# Patient Record
Sex: Male | Born: 1965 | Race: White | Hispanic: No | Smoking: Never smoker
Health system: Southern US, Community
[De-identification: ages and names within clinical notes are randomized; demographics above are authoritative.]

## PROBLEM LIST (undated history)

## (undated) DIAGNOSIS — I447 Left bundle-branch block, unspecified: Secondary | ICD-10-CM

## (undated) DIAGNOSIS — G894 Chronic pain syndrome: Secondary | ICD-10-CM

## (undated) HISTORY — PX: BACK SURGERY: SHX140

## (undated) HISTORY — PX: HERNIA REPAIR: SHX51

---

## 2006-04-12 ENCOUNTER — Ambulatory Visit: Payer: Self-pay | Admitting: Pain Medicine

## 2006-04-27 ENCOUNTER — Ambulatory Visit: Payer: Self-pay | Admitting: Pain Medicine

## 2006-04-28 ENCOUNTER — Ambulatory Visit: Payer: Self-pay | Admitting: Pain Medicine

## 2006-05-21 ENCOUNTER — Inpatient Hospital Stay: Payer: Self-pay | Admitting: Internal Medicine

## 2006-05-21 ENCOUNTER — Other Ambulatory Visit: Payer: Self-pay

## 2006-05-22 ENCOUNTER — Other Ambulatory Visit: Payer: Self-pay

## 2006-05-26 ENCOUNTER — Ambulatory Visit: Payer: Self-pay | Admitting: Pain Medicine

## 2006-06-28 ENCOUNTER — Ambulatory Visit: Payer: Self-pay | Admitting: Pain Medicine

## 2006-07-06 ENCOUNTER — Encounter: Payer: Self-pay | Admitting: Pain Medicine

## 2006-07-12 ENCOUNTER — Ambulatory Visit: Payer: Self-pay | Admitting: Gastroenterology

## 2006-07-28 ENCOUNTER — Ambulatory Visit: Payer: Self-pay | Admitting: Pain Medicine

## 2006-08-25 ENCOUNTER — Ambulatory Visit: Payer: Self-pay | Admitting: Pain Medicine

## 2006-09-22 ENCOUNTER — Ambulatory Visit: Payer: Self-pay | Admitting: Pain Medicine

## 2006-10-01 ENCOUNTER — Ambulatory Visit: Payer: Self-pay | Admitting: Physician Assistant

## 2006-10-05 ENCOUNTER — Ambulatory Visit: Payer: Self-pay | Admitting: Pain Medicine

## 2006-10-11 ENCOUNTER — Ambulatory Visit: Payer: Self-pay | Admitting: Pain Medicine

## 2006-10-13 ENCOUNTER — Ambulatory Visit: Payer: Self-pay | Admitting: Pain Medicine

## 2006-10-14 ENCOUNTER — Ambulatory Visit: Payer: Self-pay | Admitting: Pain Medicine

## 2006-10-27 ENCOUNTER — Ambulatory Visit: Payer: Self-pay | Admitting: Pain Medicine

## 2006-11-24 ENCOUNTER — Ambulatory Visit: Payer: Self-pay | Admitting: Pain Medicine

## 2006-12-14 ENCOUNTER — Ambulatory Visit: Payer: Self-pay | Admitting: Pain Medicine

## 2006-12-31 ENCOUNTER — Ambulatory Visit: Payer: Self-pay | Admitting: Physician Assistant

## 2007-01-20 ENCOUNTER — Ambulatory Visit: Payer: Self-pay | Admitting: Physician Assistant

## 2007-01-27 ENCOUNTER — Ambulatory Visit: Payer: Self-pay | Admitting: Pain Medicine

## 2007-02-10 ENCOUNTER — Ambulatory Visit: Payer: Self-pay | Admitting: Physician Assistant

## 2007-03-24 ENCOUNTER — Ambulatory Visit: Payer: Self-pay | Admitting: Physician Assistant

## 2007-04-25 ENCOUNTER — Ambulatory Visit: Payer: Self-pay | Admitting: Physician Assistant

## 2007-05-13 ENCOUNTER — Ambulatory Visit: Payer: Self-pay | Admitting: Physician Assistant

## 2007-07-01 ENCOUNTER — Ambulatory Visit: Payer: Self-pay | Admitting: Physician Assistant

## 2007-08-05 ENCOUNTER — Ambulatory Visit: Payer: Self-pay | Admitting: Physician Assistant

## 2007-09-06 ENCOUNTER — Ambulatory Visit: Payer: Self-pay | Admitting: Physician Assistant

## 2007-09-28 ENCOUNTER — Ambulatory Visit: Payer: Self-pay | Admitting: Physician Assistant

## 2007-10-04 ENCOUNTER — Ambulatory Visit: Payer: Self-pay | Admitting: Pain Medicine

## 2007-11-09 ENCOUNTER — Ambulatory Visit: Payer: Self-pay | Admitting: Physician Assistant

## 2008-02-09 ENCOUNTER — Ambulatory Visit: Payer: Self-pay | Admitting: Physician Assistant

## 2008-05-17 ENCOUNTER — Ambulatory Visit: Payer: Self-pay | Admitting: Physician Assistant

## 2008-08-16 ENCOUNTER — Ambulatory Visit: Payer: Self-pay | Admitting: Physician Assistant

## 2008-11-15 ENCOUNTER — Ambulatory Visit: Payer: Self-pay | Admitting: Physician Assistant

## 2009-02-05 ENCOUNTER — Ambulatory Visit: Payer: Self-pay | Admitting: Physician Assistant

## 2009-05-14 ENCOUNTER — Ambulatory Visit: Payer: Self-pay | Admitting: Physician Assistant

## 2009-08-08 ENCOUNTER — Ambulatory Visit: Payer: Self-pay | Admitting: Physician Assistant

## 2009-11-07 ENCOUNTER — Ambulatory Visit: Payer: Self-pay | Admitting: Physician Assistant

## 2009-11-13 ENCOUNTER — Emergency Department: Payer: Self-pay | Admitting: Emergency Medicine

## 2010-01-26 ENCOUNTER — Emergency Department: Payer: Self-pay | Admitting: Unknown Physician Specialty

## 2010-02-03 ENCOUNTER — Ambulatory Visit: Payer: Self-pay | Admitting: Pain Medicine

## 2010-04-21 ENCOUNTER — Ambulatory Visit: Payer: Self-pay | Admitting: Family Medicine

## 2011-06-15 ENCOUNTER — Ambulatory Visit: Payer: Self-pay | Admitting: Cardiology

## 2011-07-20 ENCOUNTER — Emergency Department: Payer: Self-pay | Admitting: Emergency Medicine

## 2012-02-26 ENCOUNTER — Other Ambulatory Visit: Payer: Self-pay | Admitting: Family Medicine

## 2012-02-26 LAB — CK-MB: CK-MB: 1.8 ng/mL (ref 0.5–3.6)

## 2012-11-05 ENCOUNTER — Ambulatory Visit: Payer: Self-pay

## 2013-04-03 ENCOUNTER — Ambulatory Visit: Payer: Self-pay | Admitting: Pain Medicine

## 2013-04-14 ENCOUNTER — Ambulatory Visit: Payer: Self-pay | Admitting: Pain Medicine

## 2013-04-18 ENCOUNTER — Emergency Department: Payer: Self-pay | Admitting: Emergency Medicine

## 2013-04-20 ENCOUNTER — Ambulatory Visit: Payer: Self-pay | Admitting: Pain Medicine

## 2013-05-19 ENCOUNTER — Other Ambulatory Visit: Payer: Self-pay | Admitting: Pain Medicine

## 2013-05-19 ENCOUNTER — Ambulatory Visit: Payer: Self-pay | Admitting: Pain Medicine

## 2013-05-19 LAB — MAGNESIUM: Magnesium: 1.8 mg/dL

## 2013-12-30 ENCOUNTER — Emergency Department: Payer: Self-pay | Admitting: Emergency Medicine

## 2014-05-02 ENCOUNTER — Ambulatory Visit: Payer: Self-pay | Admitting: Family Medicine

## 2018-01-27 ENCOUNTER — Other Ambulatory Visit: Payer: Self-pay

## 2018-01-27 ENCOUNTER — Ambulatory Visit
Admission: EM | Admit: 2018-01-27 | Discharge: 2018-01-27 | Disposition: A | Discharge status: Home / Self Care | Payer: Self-pay | Attending: Emergency Medicine | Admitting: Emergency Medicine

## 2018-01-27 DIAGNOSIS — R42 Dizziness and giddiness: Secondary | ICD-10-CM | POA: Insufficient documentation

## 2018-01-27 HISTORY — DX: Chronic pain syndrome: G89.4

## 2018-01-27 LAB — GLUCOSE, CAPILLARY: GLUCOSE-CAPILLARY: 95 mg/dL (ref 65–99)

## 2018-01-27 NOTE — ED Triage Notes (Signed)
Patient complains of dizziness, nausea, lightheadness that started approx 30 minutes ago.

## 2018-01-27 NOTE — Discharge Instructions (Signed)
Your glucose and your EKG were normal.  You do not have a left bundle branch block on today's EKG.  Make sure you eat regular, small frequent meals to prevent this from happening again.  Go to the ER for the signs and symptoms we discussed.  Here is a list of primary care providers who are taking new patients:  Dr. Elizabeth Sauereanna Jones, Dr. Schuyler AmorWilliam Plonk [address] [place] [address] [phone]  [facility] [place] [address]  [place] [address]  [phone]  [facility] [address]  [place], [place] [address] [phone]  [facility] [place] [address]  [phone] [place], [place] [address]  Here are clinics/ other resources who will see you if you do not have insurance. Some have certain criteria that you must meet. Call them and find out what they are:  [facility]: [address] [place] [address] [place], [place] [address] Phone: [phone] Hours: First and Third Saturdays of each Month, 9 a.m. - 1 p.m.  [facility]: [address], [place], [place] [address] Phone: [phone] Hours: Tuesday, 4 p.m. - 8 p.m. Thursday, 1 p.m. - 8 p.m. Wednesday, 9 a.m. - [facility]  [facility] [address], [place], [place] [address] Phone: [phone] Pharmacy Phone Number: [phone] Dental Phone Number: [phone] [facility] Insurance Help: [phone]  Dental Hours: Monday - Thursday, 8 a.m. - 6 p.m.  [facility] [address] [place], [place] [address] Phone: [phone] Pharmacy Phone Number: [phone] [facility] Insurance Help: [phone]  [facility] [address] [place], [place] [address] Phone: [phone] Pharmacy Phone Number: [phone] [facility] Insurance Help: [phone]  [facility] [address] [place], [place] [address] Phone: [phone] [facility] Insurance Help: [phone]   [facility] [address] [place], [place] [address] Phone: [phone]  Go to www.goodrx.com to look up your medications. This will give you a list of where you can find your prescriptions at the most affordable prices. Or ask the pharmacist what the cash price is, or if they have any other discount programs available to help make your medication more affordable. This can be less expensive than what you would pay with insurance.

## 2018-01-27 NOTE — ED Provider Notes (Signed)
HPI  SUBJECTIVE:  Marco Mejia is a 52 y.o. male who presents with lightheadedness and dizziness described as "feeling swimmy headed" starting approximately 30 minutes prior to evaluation while at the gym.  It lasted about an hour and then resolved.  He reports nausea but denies vomiting.  He denies vertigo, diaphoresis.  He states that he had not eaten today, and ate a protein bar with some improvement in his symptoms.  No aggravating factors.  It is not associated with turning his head or positional changes.  No chest pain, shortness of breath, palpitations, syncope.  No aphasia, slurred speech, arm or leg weakness, facial droop.  No headache.  He has been in his otherwise usual state of health up until today.  No change in his medications recently.  He has had symptoms like this before from not eating.  He has a past medical history of left bundle branch block, irregular heartbeat, chronic low back pain for which he takes chronic opiates.  He has a pain management physician who takes care of this.  No history of MI, arrhythmia, A. fib, diabetes, hypoglycemia, stroke.  PMD: None.  Past Medical History:  Diagnosis Date  . Chronic pain disorder     Past Surgical History:  Procedure Laterality Date  . BACK SURGERY    . HERNIA REPAIR      Family History  Problem Relation Age of Onset  . Heart disease Mother   . Heart disease Father     Social History   Tobacco Use  . Smoking status: Never Smoker  . Smokeless tobacco: Never Used  Substance Use Topics  . Alcohol use: No    Frequency: Never  . Drug use: No    No current facility-administered medications for this encounter.   Current Outpatient Medications:  .  morphine (MS CONTIN) 30 MG 12 hr tablet, Take 30 mg by mouth every 12 (twelve) hours., Disp: , Rfl:  .  oxyCODONE-acetaminophen (PERCOCET/ROXICET) 5-325 MG tablet, Take by mouth every 4 (four) hours as needed for severe pain., Disp: , Rfl:   Allergies  Allergen  Reactions  . Sulfa Antibiotics Rash     ROS  As noted in HPI.   Physical Exam  BP 136/74 (BP Location: Left Arm)   Pulse 78   Temp 98.1 F (36.7 C) (Oral)   Resp 18   Ht 5\' 9"  (1.753 m)   Wt 200 lb (90.7 kg)   SpO2 100%   BMI 29.53 kg/m   Constitutional: Well developed, well nourished, no acute distress Eyes:  EOMI, conjunctiva normal bilaterally HENT: Normocephalic, atraumatic,mucus membranes moist Respiratory: Normal inspiratory effort, lungs clear bilaterally, good air movement Cardiovascular: Normal rate regular rhythm, no murmurs, rubs, gallops.  No carotid bruit GI: nondistended skin: No rash, skin intact Musculoskeletal: no deformities Neurologic: Alert & oriented x 3, no focal neuro deficits. Cranial nerves II through XII intact, finger-nose, heel shin within normal limits.  Tandem gait steady.  Romberg negative. Psychiatric: Speech and behavior appropriate   ED Course   Medications - No data to display  Orders Placed This Encounter  Procedures  . Glucose, capillary    Standing Status:   Standing    Number of Occurrences:   1  . CBG monitoring, ED    Standing Status:   Standing    Number of Occurrences:   1  . ED EKG    Standing Status:   Standing    Number of Occurrences:   1  Order Specific Question:   Reason for Exam    Answer:   Weakness  . EKG 12-Lead    Standing Status:   Standing    Number of Occurrences:   1    Results for orders placed or performed during the hospital encounter of 01/27/18 (from the past 24 hour(s))  Glucose, capillary     Status: None   Collection Time: 01/27/18  3:24 PM  Result Value Ref Range   Glucose-Capillary 95 65 - 99 mg/dL   No results found.  ED Clinical Impression  Lightheadedness   ED Assessment/Plan  Suspect transient hypoglycemia from not eating.  Patient is asymptomatic at this point time, however will check a fingerstick and an EKG because of his history of a left bundle branch block.  Will  provide a primary care referral list and order primary care referral for routine care.  EKG: Normal sinus rhythm, rate 78.  Normal axis, normal intervals.  No hypertrophy.  No ST-T wave elevation or other changes.  No change from Previous EKG from 2010  Care everywhere records reviewed.  Patient was seen by cardiology on 03/05/2017 for palpitations.  He had an echo stress ordered.  Is unable to find the results of this.  Glucose, EKG normal.  Again suspect transient hypoglycemia from not eating causing his symptoms.  Encouraged patient to have regular, small meals.  He will follow-up with a primary care physician of his choice.  Providing primary care referral list and ordering a primary care follow-up.  Discussed MDM, plan and followup with patient. Discussed sn/sx that should prompt return to the ED. patient agrees with plan.   No orders of the defined types were placed in this encounter.   *This clinic note was created using Dragon dictation software. Therefore, there may be occasional mistakes despite careful proofreading.   ?   Domenick GongMortenson, Ebba Goll, MD 01/27/18 2036

## 2018-07-22 ENCOUNTER — Emergency Department: Payer: Self-pay

## 2018-07-22 ENCOUNTER — Other Ambulatory Visit: Payer: Self-pay

## 2018-07-22 ENCOUNTER — Emergency Department
Admission: EM | Admit: 2018-07-22 | Discharge: 2018-07-22 | Disposition: A | Discharge status: Home / Self Care | Payer: Self-pay | Attending: Emergency Medicine | Admitting: Emergency Medicine

## 2018-07-22 DIAGNOSIS — M5431 Sciatica, right side: Secondary | ICD-10-CM | POA: Insufficient documentation

## 2018-07-22 MED ORDER — KETOROLAC TROMETHAMINE 30 MG/ML IJ SOLN
30.0000 mg | Freq: Once | INTRAMUSCULAR | Status: AC
  Administered 2018-07-22: 30 mg via INTRAMUSCULAR
  Filled 2018-07-22: qty 1

## 2018-07-22 MED ORDER — METHOCARBAMOL 500 MG PO TABS: 500.0000 mg | ORAL_TABLET | Freq: Four times a day (QID) | ORAL | 0 refills | Status: AC

## 2018-07-22 MED ORDER — MELOXICAM 15 MG PO TABS: 15.0000 mg | ORAL_TABLET | Freq: Every day | ORAL | 0 refills | Status: AC

## 2018-07-22 MED ORDER — ORPHENADRINE CITRATE 30 MG/ML IJ SOLN
60.0000 mg | Freq: Once | INTRAMUSCULAR | Status: AC
  Administered 2018-07-22: 60 mg via INTRAMUSCULAR
  Filled 2018-07-22: qty 2

## 2018-07-22 NOTE — ED Notes (Signed)
Pt verbalizes understanding of discharge instructions.

## 2018-07-22 NOTE — ED Notes (Addendum)
Pt reports has been having back pain for the past 2 weeks reports history of back surgeries due to [facility] pain eases pain some rates pain 8/10

## 2018-07-22 NOTE — ED Triage Notes (Signed)
Patient c/o lower right/medial back pain X 2-3 weeks. Patient reports hx of surgery to the same area. Patient denies new injury.

## 2018-07-22 NOTE — ED Provider Notes (Signed)
[facility] Emergency Department Provider Note  ____________________________________________  Time seen: Approximately 9:46 PM  I have reviewed the triage vital signs and the nursing notes.   HISTORY  Chief Complaint Back Pain    HPI Marco Mejia is a 52 y.o. male who presents the emergency department complaining of right lower back pain radiating into the right hip.  Patient presents with a history of chronic back pain with a history of lumbar fusion.  Patient reports that over the past 2 to 3 weeks he has had increasing lower back pain with a burning sensation into his buttock/hip region.  No weakness, bowel or bladder dysfunction, saddle anesthesia or paresthesias.  No recent trauma.  Patient denies any other injury or complaint at this time.   Patient denies any urinary symptoms, GI symptoms with this complaint.  Patient takes daily narcotics for chronic pain.  Patient is on 45 mg of extended morphine tablets, with additional 2 oxycodone with Tylenol 10 mg tabs daily for chronic pain.  It appears that patient is being tapered down on his Percocet after review of controlled substance database.   Past Medical History:  Diagnosis Date  . Chronic pain disorder     There are no active problems to display for this patient.   Past Surgical History:  Procedure Laterality Date  . BACK SURGERY    . HERNIA REPAIR      Prior to Admission medications   Medication Sig Start Date End Date Taking? Authorizing Provider  meloxicam (MOBIC) 15 MG tablet Take 1 tablet (15 mg total) by mouth daily. 07/22/18   Graceanne Guin, Delorise RoyalsJonathan D, PA-C  methocarbamol (ROBAXIN) 500 MG tablet Take 1 tablet (500 mg total) by mouth 4 (four) times daily. 07/22/18   Ival Pacer, Delorise RoyalsJonathan D, PA-C  morphine (MS CONTIN) 30 MG 12 hr tablet Take 30 mg by mouth every 12 (twelve) hours.    [provider]  oxyCODONE-acetaminophen (PERCOCET/ROXICET) 5-325 MG tablet Take by mouth every  4 (four) hours as needed for severe pain.    [provider]    Allergies Sulfa antibiotics  Family History  Problem Relation Age of Onset  . Heart disease Mother   . Heart disease Father     Social History Social History   Tobacco Use  . Smoking status: Never Smoker  . Smokeless tobacco: Never Used  Substance Use Topics  . Alcohol use: No    Frequency: Never  . Drug use: No     Review of Systems  Constitutional: No fever/chills Eyes: No visual changes. No discharge ENT: No upper respiratory complaints. Cardiovascular: no chest pain. Respiratory: no cough. No SOB. Gastrointestinal: No abdominal pain.  No nausea, no vomiting.  No diarrhea.  No constipation. Genitourinary: Negative for dysuria. No hematuria Musculoskeletal: Negative for musculoskeletal pain. Skin: Negative for rash, abrasions, lacerations, ecchymosis. Neurological: Negative for headaches, focal weakness or numbness. 10-point ROS otherwise negative.  ____________________________________________   PHYSICAL EXAM:  VITAL SIGNS: ED Triage Vitals  Enc Vitals Group     BP 07/22/18 2044 (!) 144/82     Pulse Rate 07/22/18 2044 76     Resp 07/22/18 2044 18     Temp 07/22/18 2044 98.2 F (36.8 C)     Temp Source 07/22/18 2044 Oral     SpO2 07/22/18 2044 100 %     Weight 07/22/18 2045 195 lb (88.5 kg)     Height 07/22/18 2045 5\' 9"  (1.753 m)     Head Circumference --  Peak Flow --      Pain Score 07/22/18 2045 8     Pain Loc --      Pain Edu? --      Excl. in GC? --      Constitutional: Alert and oriented. Well appearing and in no acute distress. Eyes: Conjunctivae are normal. PERRL. EOMI. Head: Atraumatic. ENT:      Ears:       Nose: No congestion/rhinnorhea.      Mouth/Throat: Mucous membranes are moist.  Neck: No stridor.  No cervical spine tenderness to palpation.  Cardiovascular: Normal rate, regular rhythm. Normal S1 and S2.  Good peripheral circulation. Respiratory:  Normal respiratory effort without tachypnea or retractions. Lungs CTAB. Good air entry to the bases with no decreased or absent breath sounds. Gastrointestinal: Bowel sounds 4 quadrants. Soft and nontender to palpation. No guarding or rigidity. No palpable masses. No distention. No CVA tenderness. Musculoskeletal: Full range of motion to all extremities. No gross deformities appreciated.  Visible scar from previous surgery.  No acute abnormality visualized in the lumbar spine.  Patient reports midline and right-sided lumbar tenderness to palpation.  No step-off.  Tender to palpation right-sided sciatic notch.  Negative straight leg raise bilaterally.  Dorsalis pedis pulse intact bilateral lower extremities.  Sensation intact and equal bilateral lower extremities. Neurologic:  Normal speech and language. No gross focal neurologic deficits are appreciated.  Skin:  Skin is warm, dry and intact. No rash noted. Psychiatric: Mood and affect are normal. Speech and behavior are normal. Patient exhibits appropriate insight and judgement.   ____________________________________________   LABS (all labs ordered are listed, but only abnormal results are displayed)  Labs Reviewed - No data to display ____________________________________________  EKG   ____________________________________________  RADIOLOGY I personally viewed and evaluated these images as part of my medical decision making, as well as reviewing the written report by the radiologist.  Dg Lumbar Spine Complete  Result Date: 07/22/2018 CLINICAL DATA:  Low back pain for the past 2 weeks. EXAM: LUMBAR SPINE - COMPLETE 4+ VIEW COMPARISON:  Lumbar spine MR dated 04/14/2013. 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 For counting purposes, the last open disc space is again labeled the L5-S1 level. Again demonstrated is pedicle screw and rod fusion at the L4-5 level with normal alignment. There  is also solid bone fusion at that level and at the L5-S1 level posteriorly. Mild anterior spur formation at multiple levels and mild dextroconvex lumbar rotary scoliosis. No fractures, pars defects or subluxations. IMPRESSION: Postoperative and degenerative changes, as described above. Electronically Signed   By: Beckie Salts M.D.   On: 07/22/2018 21:23    ____________________________________________    PROCEDURES  Procedure(s) performed:    Procedures    Medications  ketorolac (TORADOL) 30 MG/ML injection 30 mg (has no administration in time range)  orphenadrine (NORFLEX) injection 60 mg (has no administration in time range)     ____________________________________________   INITIAL IMPRESSION / ASSESSMENT AND PLAN / ED COURSE  Pertinent labs & imaging results that were available during my care of the patient were reviewed by me and considered in my medical decision making (see chart for details).  Review of the [place] CSRS was performed in accordance of the [facility] prior to dispensing any controlled drugs.      Patient's diagnosis is consistent with sciatica.  Patient presents emergency department with right lower back pain radiating into the buttocks.  Symptoms are consistent with sciatica.  X-ray reveals  in place hardware with no abnormality.  Toradol and Norflex administered in the emergency department.  No narcotics as patient has chronic narcotics both extended morphine tablets and 10 mg Percocet.  Meloxicam and Robaxin will be prescribed for symptomatic improvement.  Patient will follow with primary care. Patient is given ED precautions to return to the ED for any worsening or new symptoms.     ____________________________________________  FINAL CLINICAL IMPRESSION(S) / ED DIAGNOSES  Final diagnoses:  Sciatica of right side      NEW MEDICATIONS STARTED DURING THIS VISIT:  ED Discharge Orders         Ordered    meloxicam (MOBIC) 15 MG tablet  Daily     07/22/18  2203    methocarbamol (ROBAXIN) 500 MG tablet  4 times daily     07/22/18 2203              This chart was dictated using voice recognition software/Dragon. Despite best efforts to proofread, errors can occur which can change the meaning. Any change was purely unintentional.    Racheal PatchesCuthriell, Dene Nazir D, PA-C 07/22/18 2204    Minna AntisPaduchowski, Kevin, MD 07/22/18 2316

## 2018-10-02 DIAGNOSIS — Z79899 Other long term (current) drug therapy: Secondary | ICD-10-CM | POA: Insufficient documentation

## 2018-10-02 DIAGNOSIS — R42 Dizziness and giddiness: Secondary | ICD-10-CM | POA: Diagnosis present

## 2018-10-02 DIAGNOSIS — H00014 Hordeolum externum left upper eyelid: Secondary | ICD-10-CM | POA: Insufficient documentation

## 2018-10-02 DIAGNOSIS — R0789 Other chest pain: Secondary | ICD-10-CM | POA: Diagnosis not present

## 2018-10-03 ENCOUNTER — Encounter: Payer: Self-pay | Admitting: Emergency Medicine

## 2018-10-03 ENCOUNTER — Emergency Department: Payer: Medicare Other

## 2018-10-03 ENCOUNTER — Emergency Department
Admission: EM | Admit: 2018-10-03 | Discharge: 2018-10-05 | Discharge status: Against Medical Advice | Payer: Medicare Other | Attending: Emergency Medicine | Admitting: Emergency Medicine

## 2018-10-03 DIAGNOSIS — R42 Dizziness and giddiness: Secondary | ICD-10-CM

## 2018-10-03 DIAGNOSIS — R0789 Other chest pain: Secondary | ICD-10-CM

## 2018-10-03 DIAGNOSIS — H00014 Hordeolum externum left upper eyelid: Secondary | ICD-10-CM

## 2018-10-03 HISTORY — DX: Left bundle-branch block, unspecified: I44.7

## 2018-10-03 LAB — CBC
HCT: 48.3 % (ref 39.0–52.0)
Hemoglobin: 16.2 g/dL (ref 13.0–17.0)
MCH: 30.3 pg (ref 26.0–34.0)
MCHC: 33.5 g/dL (ref 30.0–36.0)
MCV: 90.3 fL (ref 80.0–100.0)
PLATELETS: 242 10*3/uL (ref 150–400)
RBC: 5.35 MIL/uL (ref 4.22–5.81)
RDW: 13.8 % (ref 11.5–15.5)
WBC: 8.5 10*3/uL (ref 4.0–10.5)
nRBC: 0 % (ref 0.0–0.2)

## 2018-10-03 LAB — TROPONIN I

## 2018-10-03 LAB — BASIC METABOLIC PANEL
Anion gap: 11 (ref 5–15)
BUN: 19 mg/dL (ref 6–20)
CALCIUM: 9 mg/dL (ref 8.9–10.3)
CO2: 26 mmol/L (ref 22–32)
CREATININE: 1.16 mg/dL (ref 0.61–1.24)
Chloride: 102 mmol/L (ref 98–111)
Glucose, Bld: 97 mg/dL (ref 70–99)
Potassium: 3.9 mmol/L (ref 3.5–5.1)
SODIUM: 139 mmol/L (ref 135–145)

## 2018-10-03 NOTE — Discharge Instructions (Addendum)
It was a pleasure to take care of you today, and thank you for coming to our emergency department.  If you have any questions or concerns before leaving please ask the nurse to grab me and I'm more than happy to go through your aftercare instructions again.  If you were prescribed any opioid pain medication today such as Norco, Vicodin, Percocet, morphine, hydrocodone, or oxycodone please make sure you do not drive when you are taking this medication as it can alter your ability to drive safely.  If you have any concerns once you are home that you are not improving or are in fact getting worse before you can make it to your follow-up appointment, please do not hesitate to call 911 and come back for further evaluation.  Merrily Brittle, MD  Results for orders placed or performed during the hospital encounter of 10/03/18  Basic metabolic panel  Result Value Ref Range   Sodium 139 135 - 145 mmol/L   Potassium 3.9 3.5 - 5.1 mmol/L   Chloride 102 98 - 111 mmol/L   CO2 26 22 - 32 mmol/L   Glucose, Bld 97 70 - 99 mg/dL   BUN 19 6 - 20 mg/dL   Creatinine, Ser 1.61 0.61 - 1.24 mg/dL   Calcium 9.0 8.9 - 09.6 mg/dL   GFR calc non Af Amer >60 >60 mL/min   GFR calc Af Amer >60 >60 mL/min   Anion gap 11 5 - 15  CBC  Result Value Ref Range   WBC 8.5 4.0 - 10.5 K/uL   RBC 5.35 4.22 - 5.81 MIL/uL   Hemoglobin 16.2 13.0 - 17.0 g/dL   HCT 04.5 40.9 - 81.1 %   MCV 90.3 80.0 - 100.0 fL   MCH 30.3 26.0 - 34.0 pg   MCHC 33.5 30.0 - 36.0 g/dL   RDW 91.4 78.2 - 95.6 %   Platelets 242 150 - 400 K/uL   nRBC 0.0 0.0 - 0.2 %  Troponin I  Result Value Ref Range   Troponin I <0.03 <0.03 ng/mL  Troponin I  Result Value Ref Range   Troponin I <0.03 <0.03 ng/mL   Dg Chest 2 View  Result Date: 10/03/2018 CLINICAL DATA:  52 year old male with chest pain. EXAM: CHEST - 2 VIEW COMPARISON:  Chest CT dated 05/21/2006 FINDINGS: The heart size and mediastinal contours are within normal limits. Both lungs are clear.  The visualized skeletal structures are unremarkable. IMPRESSION: No active cardiopulmonary disease. Electronically Signed   By: Elgie Collard M.D.   On: 10/03/2018 00:56   Ct Head Wo Contrast  Result Date: 10/03/2018 CLINICAL DATA:  52 year old male with dizziness and left-sided chest pain and shortness of breath. EXAM: CT HEAD WITHOUT CONTRAST TECHNIQUE: Contiguous axial images were obtained from the base of the skull through the vertex without intravenous contrast. COMPARISON:  None. FINDINGS: Brain: No evidence of acute infarction, hemorrhage, hydrocephalus, extra-axial collection or mass lesion/mass effect. Vascular: No hyperdense vessel or unexpected calcification. Skull: Normal. Negative for fracture or focal lesion. Sinuses/Orbits: No acute finding. Other: None. IMPRESSION: Normal noncontrast CT of the brain. Electronically Signed   By: Elgie Collard M.D.   On: 10/03/2018 06:48

## 2018-10-03 NOTE — ED Triage Notes (Signed)
Patient with complaint of dizziness times two days that became worse tonight. Patient states that on the was to the ER her developed left side chest pain and shortness of breath.

## 2018-10-03 NOTE — ED Notes (Signed)
Patient seen driving away by [employer] officer.

## 2018-10-03 NOTE — ED Notes (Signed)
Pt states he and his wife have to go, as she will be fired if she didn't get to work on time. Pt has no IV's, states no pain at this time, appears in no distress and wanting to walk out, denied need for wheelchair.

## 2018-10-03 NOTE — ED Provider Notes (Signed)
[facility] Emergency Department Provider Note  ____________________________________________   First MD Initiated Contact with Patient 10/03/18 0530     (approximate)  I have reviewed the triage vital signs and the nursing notes.   HISTORY  Chief Complaint Dizziness and Chest Pain   HPI Marco Mejia is a 52 y.o. male who self presents to the emergency department with 2 issues.  He says that for the past several months he has had multiple episodes of feeling "swimmy headed" and has not been evaluated by a doctor.  Earlier today while working at [employer] he began to feel "swimmy headed again" but it was stronger than he is ever had in the past.  The room is not spinning.  It does not feel like "dizziness" and is difficult for him to quantify.  Symptoms came on gradually and are now constant.  Nothing in particular seems to make them better or worse.  No tinnitus.  No headache.  No double vision or blurred vision.  He has noted some mildly painful swelling to his upper eyelid on the left that began earlier today.  Earlier at work he also had a 30-minute episode of epigastric "burning" chest pain lasting 30 minutes.  This was non-positional nonexertional.  He has a long-standing history of gastric reflux for which she takes Nexium.  He has not followed up with a primary care doctor in "a long time".  He also has a history of chronic pain and anabolic steroid abuse.  No leg swelling.  No shortness of breath.  No recent surgery travel or immobilization.  No numbness or weakness.    Past Medical History:  Diagnosis Date  . Chronic pain disorder   . LBBB (left bundle branch block)     There are no active problems to display for this patient.   Past Surgical History:  Procedure Laterality Date  . BACK SURGERY    . HERNIA REPAIR      Prior to Admission medications   Medication Sig Start Date End Date Taking? Authorizing Provider  meloxicam (MOBIC) 15 MG  tablet Take 1 tablet (15 mg total) by mouth daily. 07/22/18   Cuthriell, Delorise Royals, PA-C  methocarbamol (ROBAXIN) 500 MG tablet Take 1 tablet (500 mg total) by mouth 4 (four) times daily. 07/22/18   Cuthriell, Delorise Royals, PA-C  morphine (MS CONTIN) 30 MG 12 hr tablet Take 30 mg by mouth every 12 (twelve) hours.    [provider]  oxyCODONE-acetaminophen (PERCOCET/ROXICET) 5-325 MG tablet Take by mouth every 4 (four) hours as needed for severe pain.    [provider]    Allergies Sulfa antibiotics  Family History  Problem Relation Age of Onset  . Heart disease Mother   . Heart disease Father     Social History Social History   Tobacco Use  . Smoking status: Never Smoker  . Smokeless tobacco: Never Used  Substance Use Topics  . Alcohol use: No    Frequency: Never  . Drug use: No    Review of Systems Constitutional: No fever/chills Eyes: Positive for left eye swelling ENT: No sore throat. Cardiovascular: Positive for chest pain. Respiratory: Denies shortness of breath. Gastrointestinal: No abdominal pain.  No nausea, no vomiting.  No diarrhea.  No constipation. Genitourinary: Negative for dysuria. Musculoskeletal: Negative for back pain. Skin: Negative for rash. Neurological: Negative for headaches, focal weakness or numbness.   ____________________________________________   PHYSICAL EXAM:  VITAL SIGNS: ED Triage Vitals [10/03/18 0009]  Enc Vitals Group     BP 125/72     Pulse Rate 83     Resp 18     Temp 98 F (36.7 C)     Temp Source Oral     SpO2 100 %     Weight 205 lb (93 kg)     Height 5\' 9"  (1.753 m)     Head Circumference      Peak Flow      Pain Score 2     Pain Loc      Pain Edu?      Excl. in GC?     Constitutional: Alert and oriented x4 quite anxious appearing fidgeting and ringing his hands Eyes: PERRL EOMI. head range and brisk no nystagmus.  Faint light erythema and swelling to his upper eyelid on the left appears to be  hordeolum Head: Atraumatic.  Dermal tympanic membranes bilaterally Nose: No congestion/rhinnorhea. Mouth/Throat: No trismus Neck: No stridor.   Cardiovascular: Normal rate, regular rhythm. Grossly normal heart sounds.  Good peripheral circulation. Respiratory: Normal respiratory effort.  No retractions. Lungs CTAB and moving good air Gastrointestinal: Soft nontender Musculoskeletal: No lower extremity edema   Neurologic:  Normal speech and language. No gross focal neurologic deficits are appreciated. Normal finger-nose-finger no dysdiadochokinesis ambulates with steady gait Skin:  Skin is warm, dry and intact. No rash noted. Psychiatric: Anxious appearing   ____________________________________________   DIFFERENTIAL includes but not limited to  Central vertigo, peripheral vertigo, palpitations, arrhythmia, multiple sclerosis, metabolic derangement ____________________________________________   LABS (all labs ordered are listed, but only abnormal results are displayed)  Labs Reviewed  BASIC METABOLIC PANEL  CBC  TROPONIN I  TROPONIN I    Lab work reviewed by me with no signs of acute ischemia x2 __________________________________________  EKG  ED ECG REPORT I, Merrily Brittle, the attending physician, personally viewed and interpreted this ECG.  Date: 10/03/2018 EKG Time:  Rate: 79 Rhythm: normal sinus rhythm QRS Axis: normal Intervals: First-degree AV block ST/T Wave abnormalities: normal Narrative Interpretation: no evidence of acute ischemia  ____________________________________________  RADIOLOGY  Chest x-ray reviewed by me with no acute disease Head CT reviewed by me with no acute disease ____________________________________________   PROCEDURES  Procedure(s) performed: no  Procedures  Critical Care performed: no  ____________________________________________   INITIAL IMPRESSION / ASSESSMENT AND PLAN / ED COURSE  Pertinent labs & imaging  results that were available during my care of the patient were reviewed by me and considered in my medical decision making (see chart for details).   As part of my medical decision making, I reviewed the following data within the electronic MEDICAL RECORD NUMBER History obtained from family if available, nursing notes, old chart and ekg, as well as notes from prior ED visits.  The patient has 2 issues today.  First his chest pain is quite atypical.  His epigastric burning and nonexertional lasting only 30 minutes and then resolved.  His first troponin was negative so I sent another one given his age and comorbidities which fortunately is negative.  EKG is nonischemic.  More concerning to me is actually his "swimmy headed" feeling that is currently active.  Normal tympanic membranes no cerebellar signs and it does not sound quite like vertigo.  I did get a head CT as he is never been imaged before which fortunately is negative.  He was kept on monitor multiple hours with no ectopy noted and he is symptomatic while clearly in sinus rhythm.  My differential  most prominently would be vertigo versus neurologic versus cardiac.  I do not think is actually cardiac given his lack of arrhythmia with symptoms, I do not think is central vertigo, I think he would benefit from an outpatient MRI.  I will help him establish care with both neurology and primary care.  Regarding the swelling on his left eye it appears to be a hordeolum and have advised him to use warm compresses.  Strict return precautions have been given and the patient verbalizes understanding and agreement with the plan.      ____________________________________________   FINAL CLINICAL IMPRESSION(S) / ED DIAGNOSES  Final diagnoses:  Lightheaded  Atypical chest pain  Hordeolum externum of left upper eyelid      NEW MEDICATIONS STARTED DURING THIS VISIT:  New Prescriptions   No medications on file     Note:  This document was prepared using  Dragon voice recognition software and may include unintentional dictation errors.     Merrily Brittle, MD 10/03/18 [phone]

## 2018-10-03 NOTE — ED Notes (Signed)
Pt will call back for this RN to discuss d/c papers over the phone.

## 2018-10-03 NOTE — ED Notes (Signed)
Pt standing up in room stating his wife had to get to work and that they were in a huge hurry. Dr Lenard Lance made aware, involved with an emergent patient at this time.

## 2018-10-03 NOTE — ED Notes (Signed)
Patient seen leaving lobby, walking outside

## 2018-10-05 ENCOUNTER — Emergency Department
Admission: EM | Admit: 2018-10-05 | Discharge: 2018-10-05 | Disposition: A | Discharge status: Home / Self Care | Payer: Medicare Other | Source: Home / Self Care | Attending: Emergency Medicine | Admitting: Emergency Medicine

## 2018-10-05 ENCOUNTER — Other Ambulatory Visit: Payer: Self-pay

## 2018-10-05 ENCOUNTER — Encounter: Payer: Self-pay | Admitting: *Deleted

## 2018-10-05 DIAGNOSIS — Z79899 Other long term (current) drug therapy: Secondary | ICD-10-CM | POA: Insufficient documentation

## 2018-10-05 DIAGNOSIS — H00014 Hordeolum externum left upper eyelid: Secondary | ICD-10-CM | POA: Insufficient documentation

## 2018-10-05 MED ORDER — ERYTHROMYCIN 5 MG/GM OP OINT
TOPICAL_OINTMENT | Freq: Once | OPHTHALMIC | Status: AC
Start: 2018-10-05 — End: 2018-10-05
  Administered 2018-10-05: 04:00:00 via OPHTHALMIC
  Filled 2018-10-05: qty 1

## 2018-10-05 MED ORDER — ERYTHROMYCIN 5 MG/GM OP OINT: 1.0000 "application " | TOPICAL_OINTMENT | Freq: Three times a day (TID) | OPHTHALMIC | 0 refills | Status: AC

## 2018-10-05 NOTE — ED Triage Notes (Addendum)
Pt has swelling and redness with drainage to left eye since this morning.  No known injury to eye.  Pt alert.  Pt was seen here this week with similar sx , dx with stye.

## 2018-10-05 NOTE — ED Provider Notes (Signed)
[facility] Emergency Department Provider Note   First MD Initiated Contact with Patient 10/05/18 0400     (approximate)  I have reviewed the triage vital signs and the nursing notes.   HISTORY  Chief Complaint Stye    HPI Marco Mejia is a 52 y.o. male with below list of chronic medical conditions returns to the emergency department with left upper eyelid swelling and associated redness with drainage noted since this morning.  Patient denies any injury to the eye.  Patient was seen on 10/03/2018 for multiple complaints including a stye for which the patient was advised to apply warm compress which he said he has done however symptoms have worsened.  Fever.  Patient denies any visual changes   Past Medical History:  Diagnosis Date  . Chronic pain disorder   . LBBB (left bundle branch block)     There are no active problems to display for this patient.   Past Surgical History:  Procedure Laterality Date  . BACK SURGERY    . HERNIA REPAIR      Prior to Admission medications   Medication Sig Start Date End Date Taking? Authorizing Provider  erythromycin ophthalmic ointment Place 1 application into the left eye 3 (three) times daily for 7 days. 10/05/18 10/12/18  Darci Current, MD  meloxicam (MOBIC) 15 MG tablet Take 1 tablet (15 mg total) by mouth daily. 07/22/18   Cuthriell, Delorise Royals, PA-C  methocarbamol (ROBAXIN) 500 MG tablet Take 1 tablet (500 mg total) by mouth 4 (four) times daily. 07/22/18   Cuthriell, Delorise Royals, PA-C  morphine (MS CONTIN) 30 MG 12 hr tablet Take 30 mg by mouth every 12 (twelve) hours.    [provider]  oxyCODONE-acetaminophen (PERCOCET/ROXICET) 5-325 MG tablet Take by mouth every 4 (four) hours as needed for severe pain.    [provider]    Allergies Sulfa antibiotics  Family History  Problem Relation Age of Onset  . Heart disease Mother   . Heart disease Father     Social  History Social History   Tobacco Use  . Smoking status: Never Smoker  . Smokeless tobacco: Never Used  Substance Use Topics  . Alcohol use: No    Frequency: Never  . Drug use: No    Review of Systems Constitutional: No fever/chills Eyes: No visual changes. ENT: No sore throat. Cardiovascular: Denies chest pain. Respiratory: Denies shortness of breath. Gastrointestinal: No abdominal pain.  No nausea, no vomiting.  No diarrhea.  No constipation. Genitourinary: Negative for dysuria. Musculoskeletal: Negative for neck pain.  Negative for back pain. Integumentary: Negative for rash. Neurological: Negative for headaches, focal weakness or numbness.   ____________________________________________   PHYSICAL EXAM:  VITAL SIGNS: ED Triage Vitals [10/05/18 0225]  Enc Vitals Group     BP 140/80     Pulse Rate 82     Resp 20     Temp 98.4 F (36.9 C)     Temp Source Oral     SpO2 100 %     Weight 93 kg (205 lb)     Height 1.753 m (5\' 9" )     Head Circumference      Peak Flow      Pain Score 0     Pain Loc      Pain Edu?      Excl. in GC?     Constitutional: Alert and oriented. Well appearing and in no acute distress. Eyes: Conjunctivae are normal.  Left upper eyelid hordeolum noted with scant purulent drainage Head: Atraumatic. Mouth/Throat: Mucous membranes are moist.  Oropharynx non-erythematous. Neck: No stridor.  Musculoskeletal: No lower extremity tenderness nor edema. No gross deformities of extremities. Neurologic:  Normal speech and language. No gross focal neurologic deficits are appreciated.  Skin:  Skin is warm, dry and intact. No rash noted. Psychiatric: Mood and affect are normal. Speech and behavior are normal.      Procedures   ____________________________________________   INITIAL IMPRESSION / ASSESSMENT AND PLAN / ED COURSE  As part of my medical decision making, I reviewed the following data within the electronic MEDICAL RECORD NUMBER   52 year old male presenting with above-stated history and physical exam consistent with left upper eyelid hordeolum given worsening of symptoms despite using warm compress erythromycin ophthalmic applied and will be prescribed for home        ____________________________________________  FINAL CLINICAL IMPRESSION(S) / ED DIAGNOSES  Final diagnoses:  Hordeolum externum of left upper eyelid     MEDICATIONS GIVEN DURING THIS VISIT:  Medications  erythromycin ophthalmic ointment ( Left Eye Given 10/05/18 0424)     ED Discharge Orders         Ordered    erythromycin ophthalmic ointment  3 times daily     10/05/18 0420           Note:  This document was prepared using Dragon voice recognition software and may include unintentional dictation errors.    Darci Current, MD 10/05/18 [phone]

## 2019-06-17 IMAGING — CT CT HEAD W/O CM
3 series · 15 of 47 positions shown, 18 images · non-contrast
Comparison: None.

CLINICAL DATA: 52-year-old male with dizziness and left-sided chest
pain and shortness of breath.

EXAM:
CT HEAD WITHOUT CONTRAST
TECHNIQUE: Contiguous axial images were obtained from the base of the skull
through the vertex without intravenous contrast.

[Series 2: head wo · axial · 0.43mm/px · z∈[-73,+57]mm · 9 of 32 slices shown, 12 images]
[im 3/32  brain]
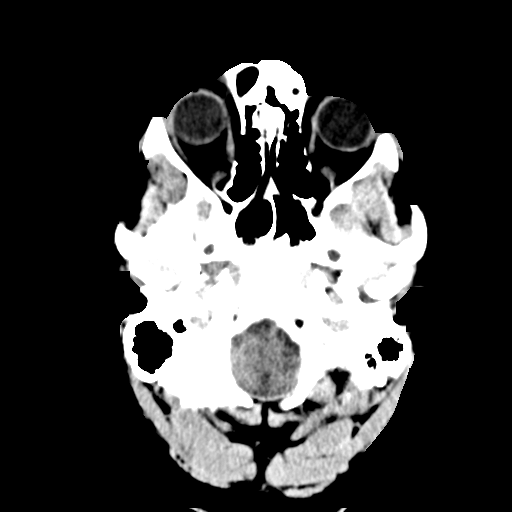
[im 3/32  bone]
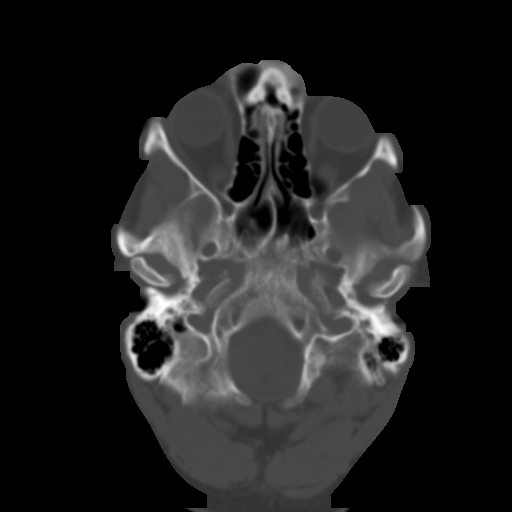
[im 6/32  brain]
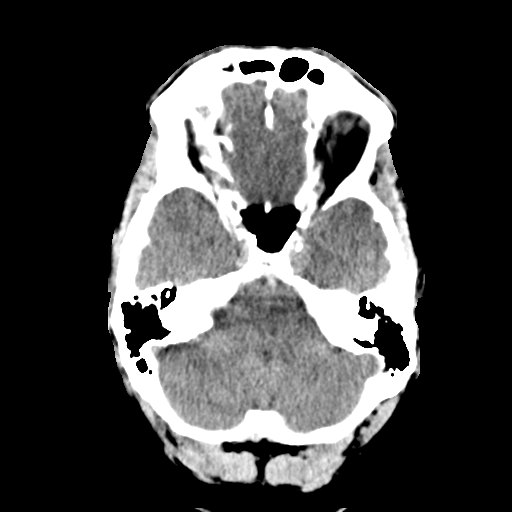
[im 9/32  brain]
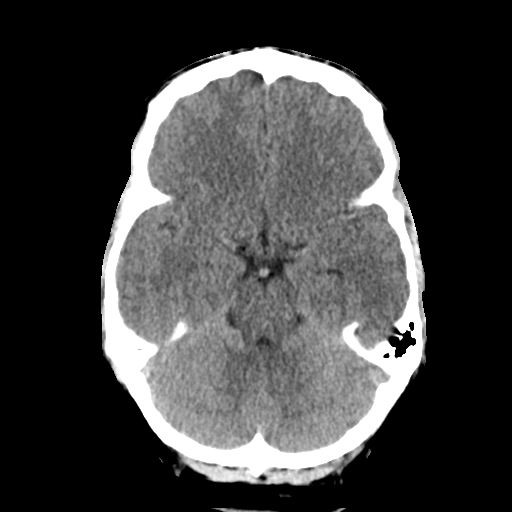
[im 12/32  brain]
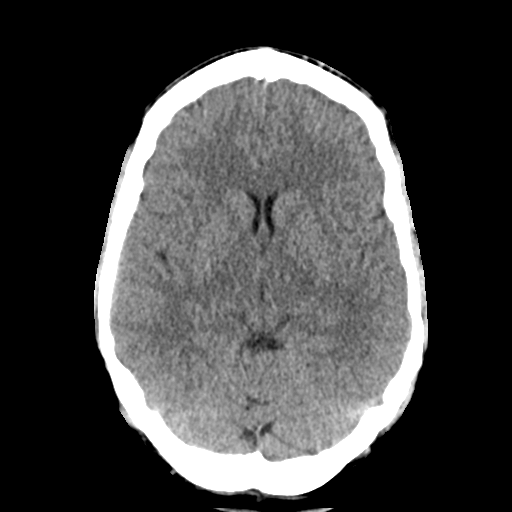
[im 17/32  brain]
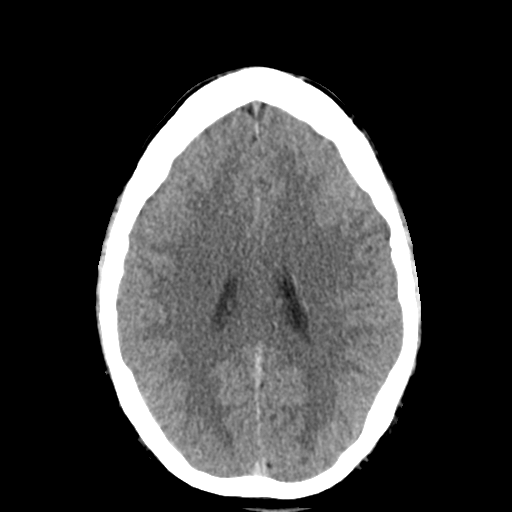
[im 17/32  bone]
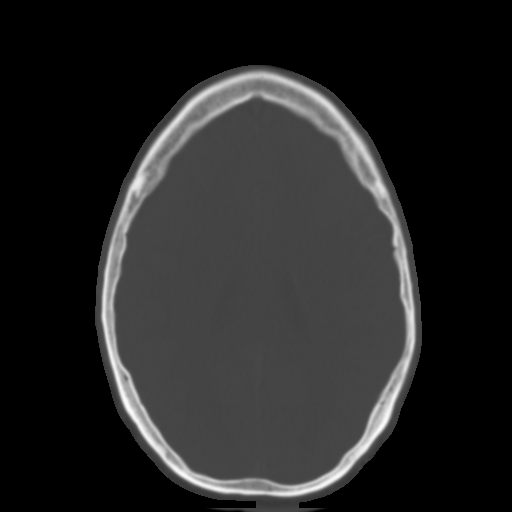
[im 20/32  brain]
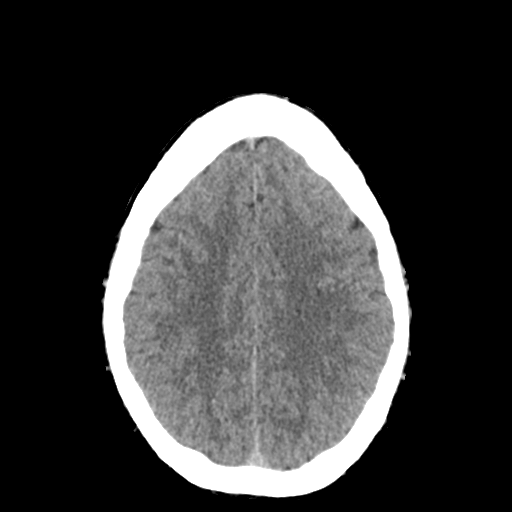
[im 23/32  brain]
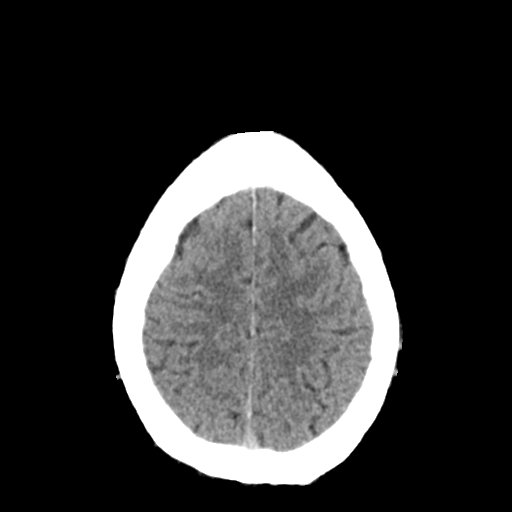
[im 26/32  brain]
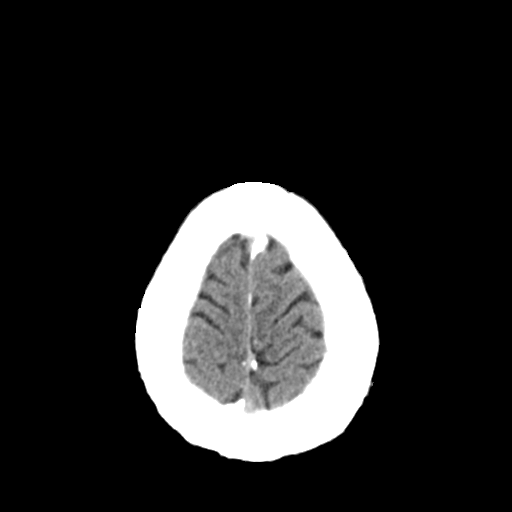
[im 29/32  brain]
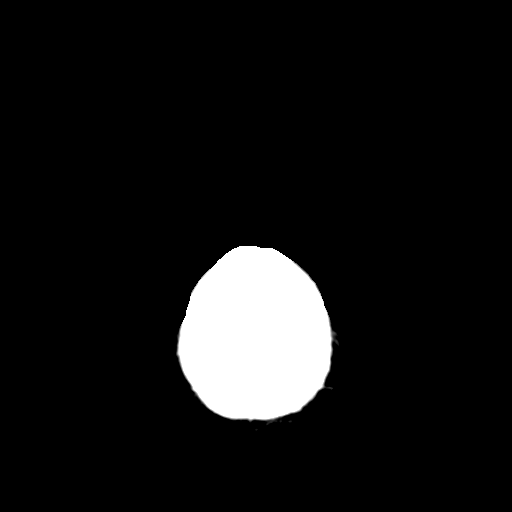
[im 29/32  bone]
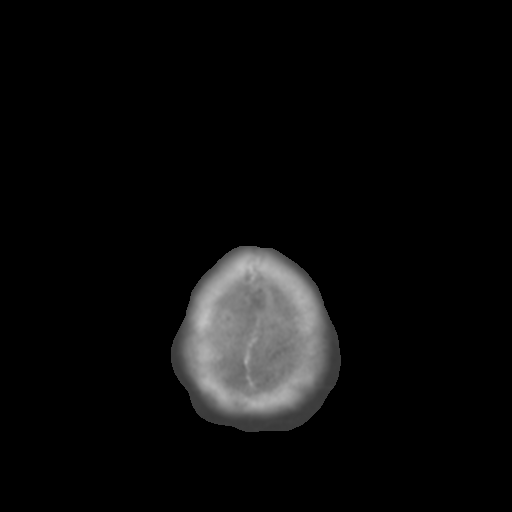

[Series 4: coronal soft tissue · coronal · 0.30mm/px · 3 of 72 slices shown]
[im 24/72  brain]
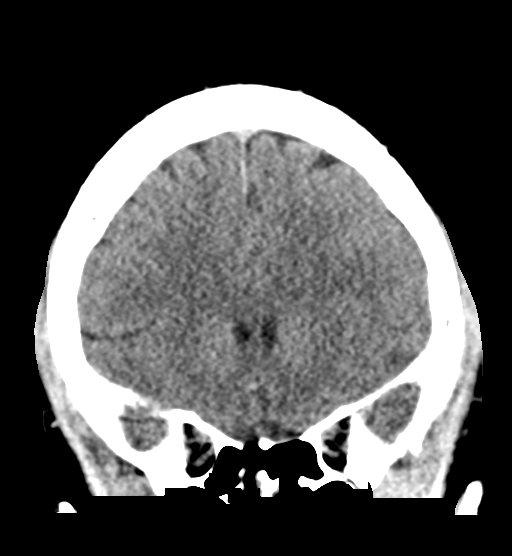
[im 32/72  brain]
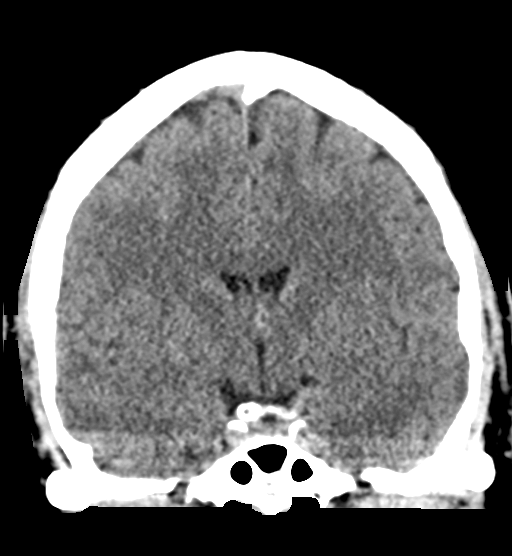
[im 40/72  brain]
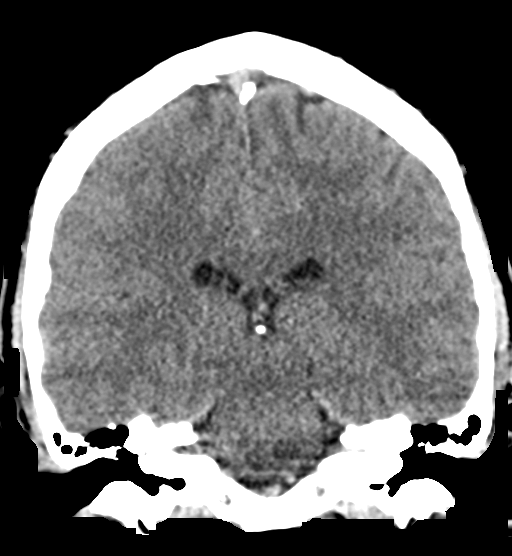

[Series 5: sagittal soft tissue · sagittal · 0.30mm/px · 3 of 56 slices shown]
[im 19/56  brain]
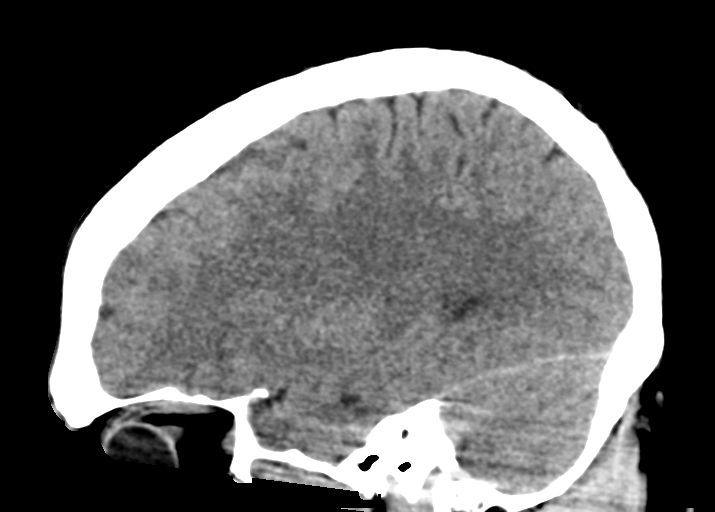
[im 28/56  brain]
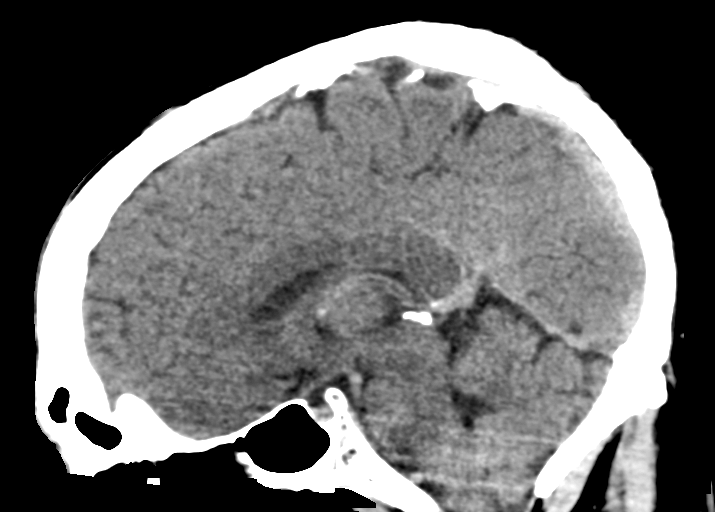
[im 37/56  brain]
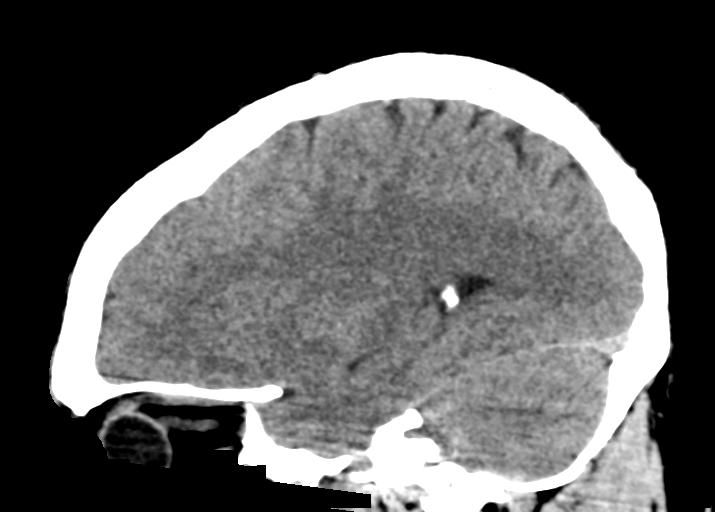

[15 of 47 positions shown; findings below may reference images not displayed]

FINDINGS: Brain: No evidence of acute infarction, hemorrhage, hydrocephalus,
extra-axial collection or mass lesion/mass effect.

Vascular: No hyperdense vessel or unexpected calcification.

Skull: Normal. Negative for fracture or focal lesion.

Sinuses/Orbits: No acute finding.

Other: None.
IMPRESSION: Normal noncontrast CT of the brain.

## 2019-11-21 ENCOUNTER — Other Ambulatory Visit: Payer: Self-pay | Admitting: General Surgery

## 2019-11-21 DIAGNOSIS — K439 Ventral hernia without obstruction or gangrene: Secondary | ICD-10-CM

## 2019-11-28 ENCOUNTER — Ambulatory Visit: Admission: RE | Admit: 2019-11-28 | Payer: Medicare Other | Source: Ambulatory Visit

## 2019-11-29 ENCOUNTER — Ambulatory Visit
Admission: RE | Admit: 2019-11-29 | Discharge: 2019-11-29 | Disposition: A | Discharge status: Home / Self Care | Payer: Medicare Other | Source: Ambulatory Visit | Attending: General Surgery | Admitting: General Surgery

## 2019-11-29 ENCOUNTER — Other Ambulatory Visit: Payer: Self-pay

## 2019-11-29 DIAGNOSIS — K439 Ventral hernia without obstruction or gangrene: Secondary | ICD-10-CM | POA: Diagnosis present

## 2019-12-06 ENCOUNTER — Other Ambulatory Visit: Payer: Self-pay | Admitting: General Surgery

## 2019-12-06 DIAGNOSIS — K769 Liver disease, unspecified: Secondary | ICD-10-CM

## 2019-12-15 ENCOUNTER — Ambulatory Visit
Admission: RE | Admit: 2019-12-15 | Discharge: 2019-12-15 | Disposition: A | Discharge status: Home / Self Care | Payer: Medicare Other | Source: Ambulatory Visit | Attending: General Surgery | Admitting: General Surgery

## 2019-12-15 ENCOUNTER — Other Ambulatory Visit: Payer: Self-pay

## 2019-12-15 DIAGNOSIS — K769 Liver disease, unspecified: Secondary | ICD-10-CM

## 2019-12-15 LAB — POCT I-STAT CREATININE: Creatinine, Ser: 1.4 mg/dL — ABNORMAL HIGH (ref 0.61–1.24)

## 2019-12-15 MED ORDER — GADOBUTROL 1 MMOL/ML IV SOLN: 9.0000 mL | Freq: Once | INTRAVENOUS | Status: DC | PRN

## 2020-01-15 ENCOUNTER — Other Ambulatory Visit: Payer: Self-pay | Admitting: Gastroenterology

## 2020-01-15 DIAGNOSIS — R160 Hepatomegaly, not elsewhere classified: Secondary | ICD-10-CM

## 2020-01-22 ENCOUNTER — Ambulatory Visit
Admission: RE | Admit: 2020-01-22 | Discharge: 2020-01-22 | Disposition: A | Discharge status: Home / Self Care | Payer: Medicare Other | Source: Ambulatory Visit | Attending: Gastroenterology | Admitting: Gastroenterology

## 2020-01-22 ENCOUNTER — Other Ambulatory Visit: Payer: Self-pay

## 2020-01-22 DIAGNOSIS — R160 Hepatomegaly, not elsewhere classified: Secondary | ICD-10-CM

## 2020-08-12 IMAGING — CT CT ABDOMEN W/O CM
1 of 2 series · 14 of 32 positions shown, 19 images · non-contrast
Comparison: None.

CLINICAL DATA: Mid abdominal pain for 1 year

EXAM:
CT ABDOMEN WITHOUT CONTRAST
TECHNIQUE: Multidetector CT imaging of the abdomen was performed following the
standard protocol without IV contrast.

[Series 2: axial st · axial · 0.78mm/px · z∈[-1010,-750]mm · 14 of 58 slices shown, 19 images]
[im 3/58  soft-tissue]
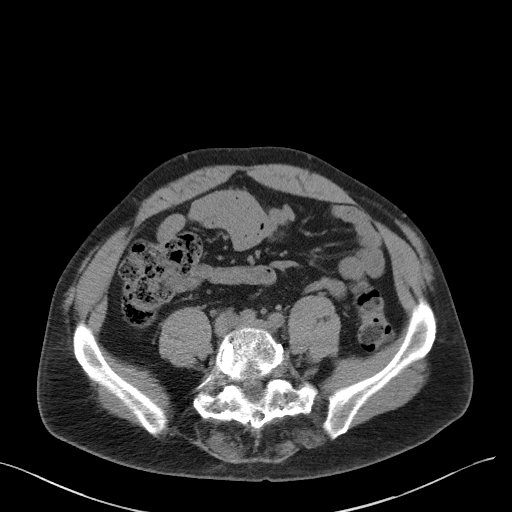
[im 3/58  bone]
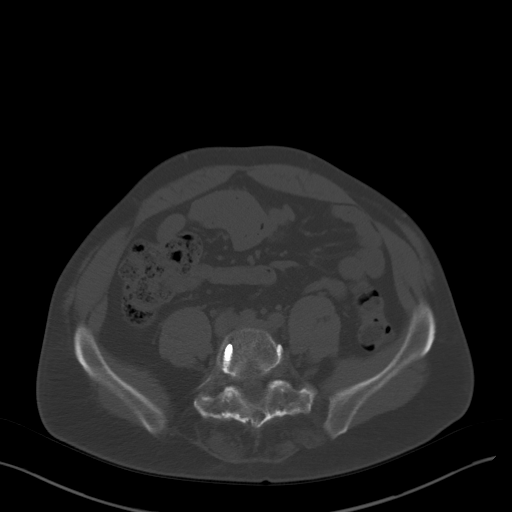
[im 9/58  soft-tissue]
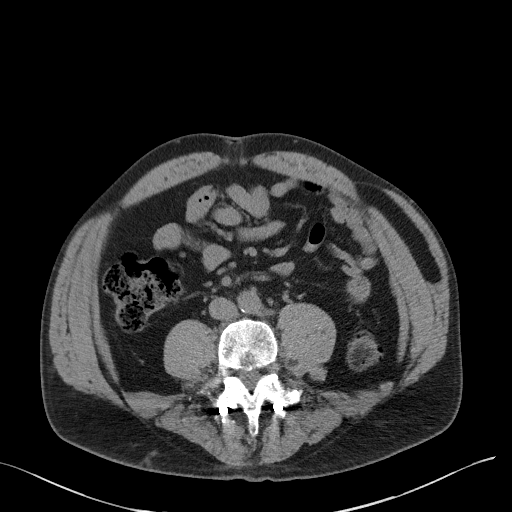
[im 12/58  soft-tissue]
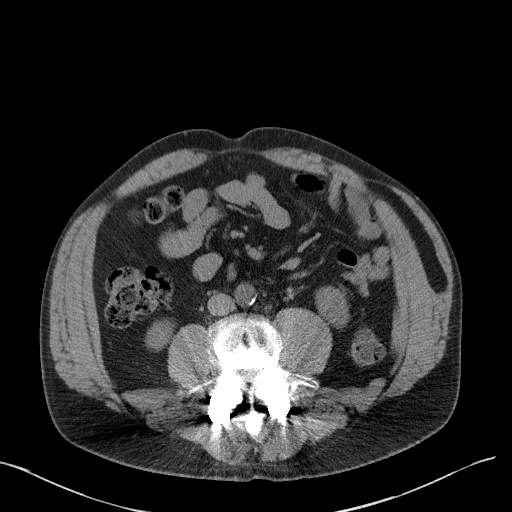
[im 18/58  soft-tissue]
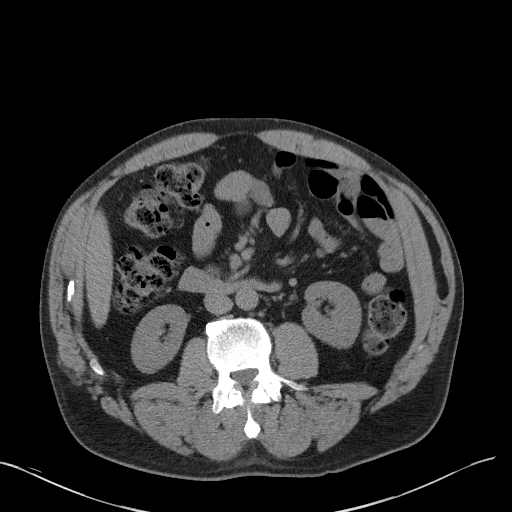
[im 20/58  soft-tissue]
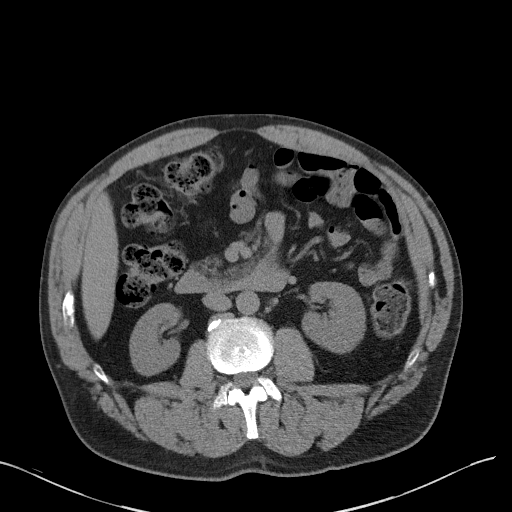
[im 26/58  soft-tissue]
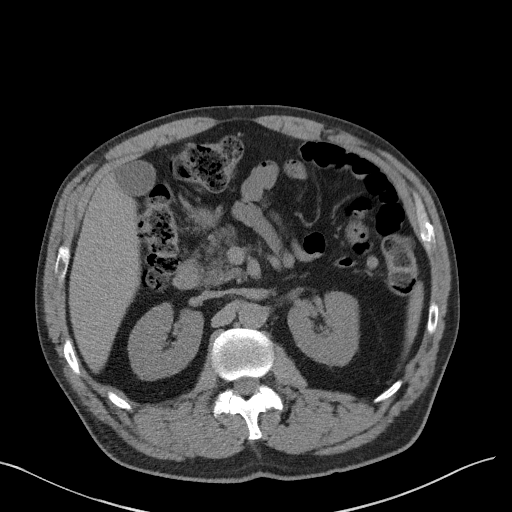
[im 29/58  soft-tissue]
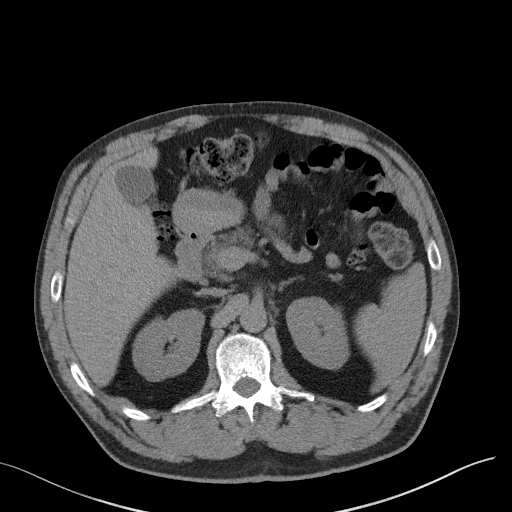
[im 32/58  soft-tissue]
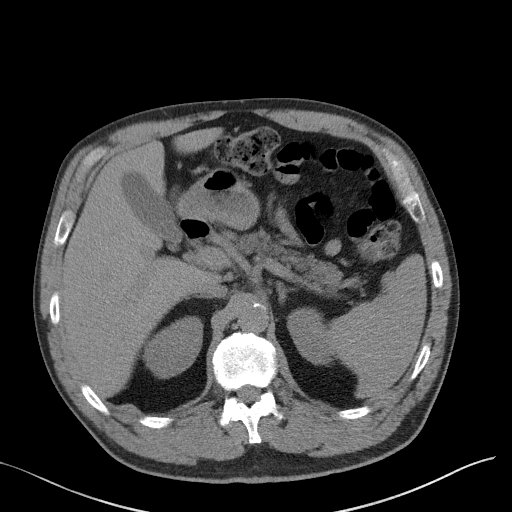
[im 38/58  soft-tissue]
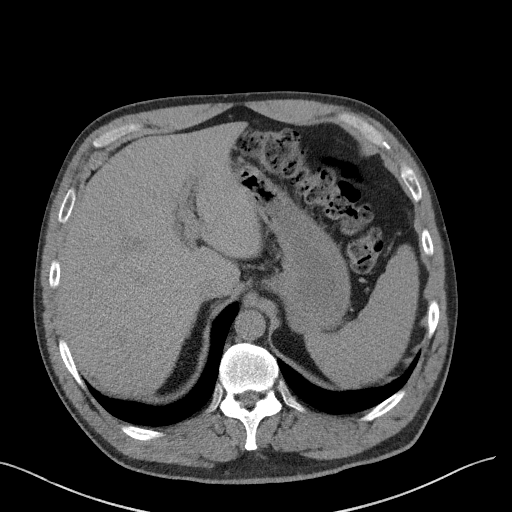
[im 38/58  bone]
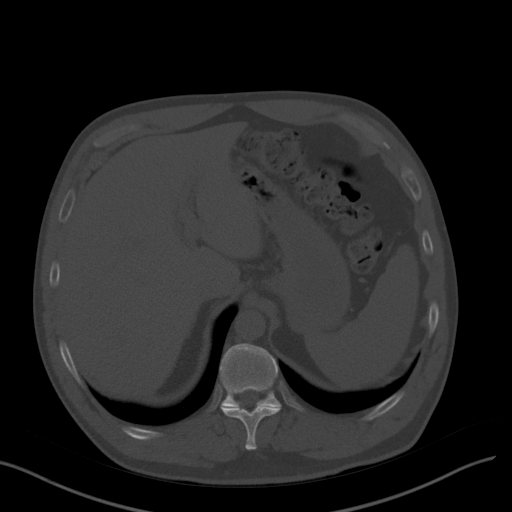
[im 40/58  soft-tissue]
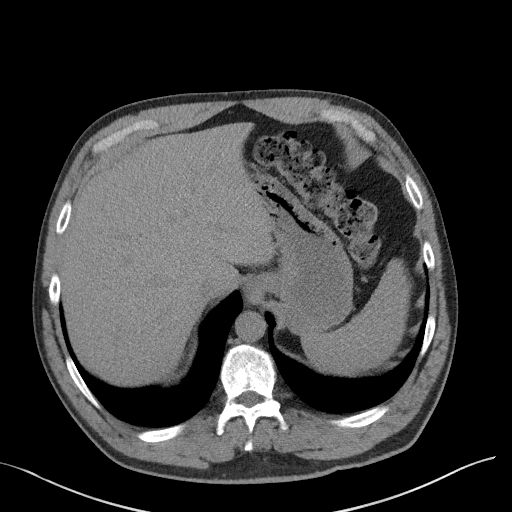
[im 46/58  soft-tissue]
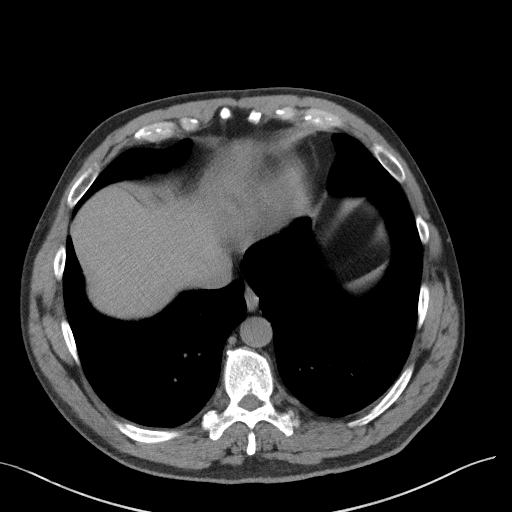
[im 46/58  lung]
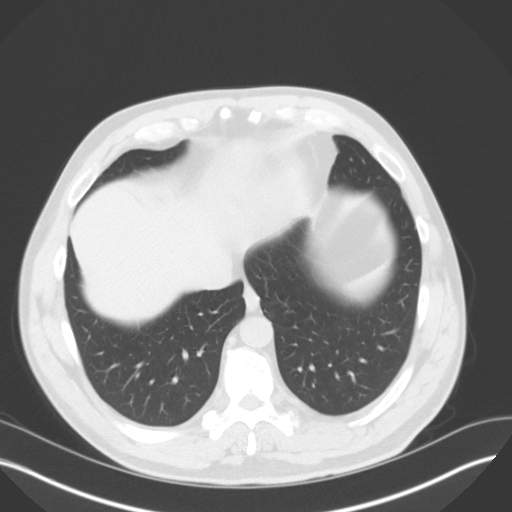
[im 49/58  soft-tissue]
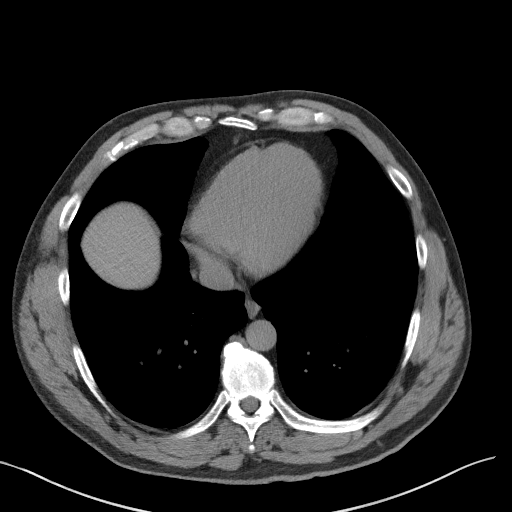
[im 49/58  lung]
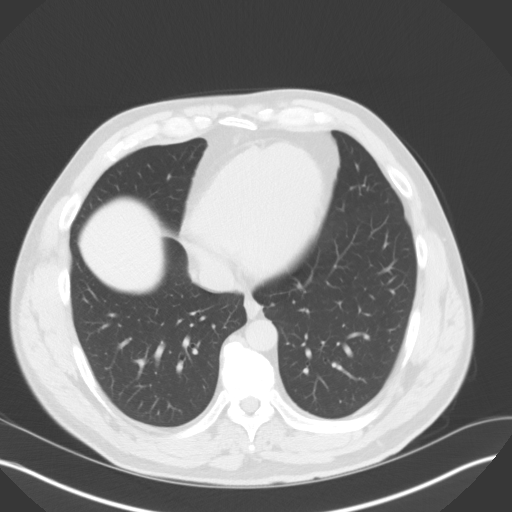
[im 52/58  lung]
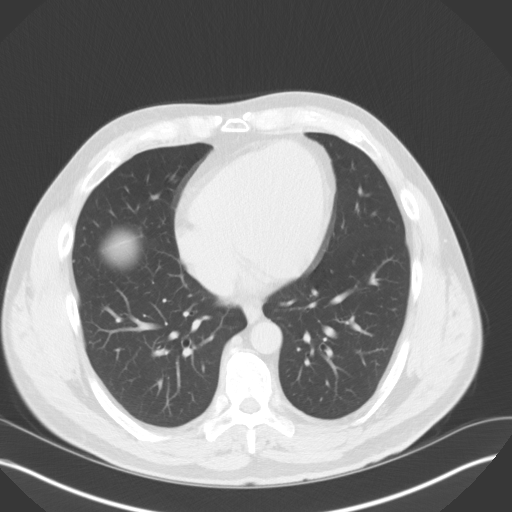
[im 55/58  soft-tissue]
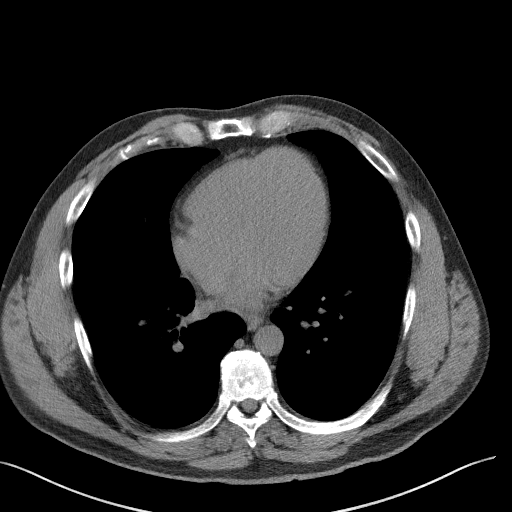
[im 55/58  lung]
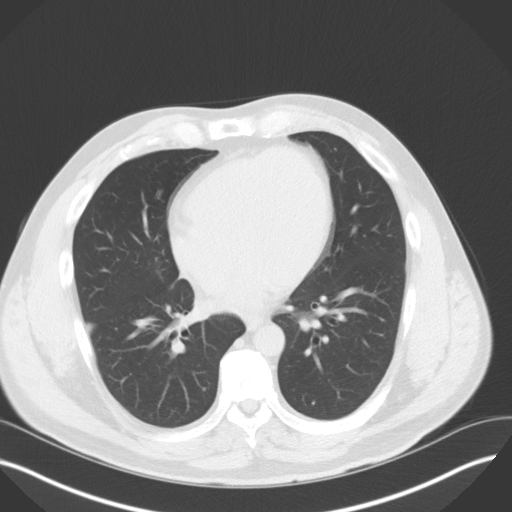

[14 of 32 positions shown; findings below may reference images not displayed]

FINDINGS: Lower chest: Lung bases are clear. No effusions. Heart is normal
size.

Hepatobiliary: Vague low-density lesion within the right hepatic
lobe measuring 16 mm. This cannot be characterized on this
noncontrast study and warrants further evaluation. Gallbladder
unremarkable.

Pancreas: No focal abnormality or ductal dilatation.

Spleen: No focal abnormality.  Normal size.

Adrenals/Urinary Tract: No hydronephrosis. No renal or adrenal mass.

Stomach/Bowel: Visualized stomach, large and small bowel
unremarkable. Moderate stool in the colon.

Vascular/Lymphatic: Aortic atherosclerosis. No enlarged abdominal
lymph nodes.

Other: No free fluid or free air.

Musculoskeletal: Postoperative changes in the lumbar spine. No acute
bony abnormality.
IMPRESSION: 16 mm low-density lesion in the right hepatic lobe which cannot be
characterized on this noncontrast study. Recommend further
evaluation with contrast-enhanced CT or MRI.

Moderate stool within the colon.
